# Patient Record
Sex: Male | Born: 1975 | Race: Black or African American | Hispanic: No | Marital: Married | Smoking: Former smoker
Health system: Southern US, Community
[De-identification: ages and names within clinical notes are randomized; demographics above are authoritative.]

## PROBLEM LIST (undated history)

## (undated) DIAGNOSIS — I10 Essential (primary) hypertension: Secondary | ICD-10-CM

---

## 2010-12-26 ENCOUNTER — Other Ambulatory Visit: Payer: Managed Care, Other (non HMO)

## 2012-02-01 ENCOUNTER — Encounter (HOSPITAL_COMMUNITY): Payer: Self-pay | Admitting: *Deleted

## 2012-02-01 ENCOUNTER — Emergency Department (INDEPENDENT_AMBULATORY_CARE_PROVIDER_SITE_OTHER)
Admission: EM | Admit: 2012-02-01 | Discharge: 2012-02-01 | Disposition: A | Discharge status: Home / Self Care | Payer: Managed Care, Other (non HMO) | Source: Home / Self Care | Attending: Emergency Medicine | Admitting: Emergency Medicine

## 2012-02-01 DIAGNOSIS — H6690 Otitis media, unspecified, unspecified ear: Secondary | ICD-10-CM

## 2012-02-01 DIAGNOSIS — H6090 Unspecified otitis externa, unspecified ear: Secondary | ICD-10-CM

## 2012-02-01 DIAGNOSIS — H6120 Impacted cerumen, unspecified ear: Secondary | ICD-10-CM

## 2012-02-01 DIAGNOSIS — H60399 Other infective otitis externa, unspecified ear: Secondary | ICD-10-CM

## 2012-02-01 HISTORY — DX: Essential (primary) hypertension: I10

## 2012-02-01 MED ORDER — AMOXICILLIN 500 MG PO CAPS: 1000.0000 mg | ORAL_CAPSULE | Freq: Three times a day (TID) | ORAL | Status: DC

## 2012-02-01 MED ORDER — NEOMYCIN-POLYMYXIN-HC 3.5-10000-1 OT SUSP: 3.0000 [drp] | Freq: Four times a day (QID) | OTIC | Status: DC

## 2012-02-01 NOTE — ED Provider Notes (Signed)
Chief Complaint  Patient presents with  . Ear Fullness    History of Present Illness:   The patient is a 36 year old male who presents today with a three-day history of bilateral ear congestion. He denies any pain or drainage. He's had no fever, chills, headache, nasal congestion, rhinorrhea, or sore throat.  Review of Systems:  Other than noted above, the patient denies any of the following symptoms: Systemic:  No fevers, chills, sweats, weight loss or gain, fatigue, or tiredness. Eye:  No redness, pain, discharge, itching, blurred vision, or diplopia. ENT:  No headache, nasal congestion, sneezing, itching, epistaxis, ear pain, congestion, decreased hearing, ringing in ears, vertigo, or tinnitus.  No oral lesions, sore throat, pain on swallowing, or hoarseness. Neck:  No mass, tenderness or adenopathy. Lungs:  No coughing, wheezing, or shortness of breath. Skin:  No rash or itching.  PMFSH:  Past medical history, family history, social history, meds, and allergies were reviewed.  Physical Exam:   Vital signs:  BP 147/87  Pulse 79  Temp 98.1 F (36.7 C) (Oral)  Resp 16  SpO2 98% General:  Alert and oriented.  In no distress.  Skin warm and dry. Eye:  PERRL, full EOMs, lids and conjunctiva normal.   ENT:  He has large cerumen impactions in both ear canals. These were irrigated out with some difficulty and thereafter the ear canals and TMs were slightly erythematous bilaterally.  Nasal mucosa not congested and without drainage.  Mucous membranes moist, no oral lesions, normal dentition, pharynx clear.  No cranial or facial pain to palplation. Neck:  Supple, full ROM.  No adenopathy, tenderness or mass.  Thyroid normal. Lungs:  Breath sounds clear and equal bilaterally.  No wheezes, rales or rhonchi. Heart:  Rhythm regular, without extrasystoles.  No gallops or murmers. Skin:  Clear, warm and dry.  Assessment:  The primary encounter diagnosis was Cerumen impaction. Diagnoses of Otitis  externa and Otitis media were also pertinent to this visit.  Plan:   1.  The following meds were prescribed:   New Prescriptions   AMOXICILLIN (AMOXIL) 500 MG CAPSULE    Take 2 capsules (1,000 mg total) by mouth 3 (three) times daily.   NEOMYCIN-POLYMYXIN-HYDROCORTISONE (CORTISPORIN) 3.5-10000-1 OTIC SUSPENSION    Place 3 drops into both ears 4 (four) times daily.   2.  The patient was instructed in symptomatic care and handouts were given. 3.  The patient was told to return if becoming worse in any way, if no better in 3 or 4 days, and given some red flag symptoms that would indicate earlier return.  Follow up:  The patient was told to follow up here if no better in 3 or 4 days.       Reuben Likes, MD 02/01/12 [phone]

## 2012-02-01 NOTE — ED Notes (Signed)
C/O bilat ears feeling plugged x 2 days.  Denies cold sxs.  Denies pain.  Has tried peroxide and sweet oil without relief.

## 2017-03-02 ENCOUNTER — Emergency Department (HOSPITAL_COMMUNITY)
Admission: EM | Admit: 2017-03-02 | Discharge: 2017-03-02 | Disposition: A | Discharge status: Home / Self Care | Payer: 59 | Attending: Emergency Medicine | Admitting: Emergency Medicine

## 2017-03-02 ENCOUNTER — Emergency Department (HOSPITAL_COMMUNITY): Payer: 59

## 2017-03-02 ENCOUNTER — Encounter (HOSPITAL_COMMUNITY): Payer: Self-pay | Admitting: Emergency Medicine

## 2017-03-02 DIAGNOSIS — R072 Precordial pain: Secondary | ICD-10-CM | POA: Diagnosis not present

## 2017-03-02 DIAGNOSIS — Z79899 Other long term (current) drug therapy: Secondary | ICD-10-CM | POA: Diagnosis not present

## 2017-03-02 DIAGNOSIS — R0789 Other chest pain: Secondary | ICD-10-CM | POA: Diagnosis present

## 2017-03-02 LAB — CBC
HEMATOCRIT: 52.6 % — AB (ref 39.0–52.0)
Hemoglobin: 18.5 g/dL — ABNORMAL HIGH (ref 13.0–17.0)
MCH: 31.8 pg (ref 26.0–34.0)
MCHC: 35.2 g/dL (ref 30.0–36.0)
MCV: 90.4 fL (ref 78.0–100.0)
Platelets: 304 10*3/uL (ref 150–400)
RBC: 5.82 MIL/uL — ABNORMAL HIGH (ref 4.22–5.81)
RDW: 13.2 % (ref 11.5–15.5)
WBC: 8.2 10*3/uL (ref 4.0–10.5)

## 2017-03-02 LAB — BASIC METABOLIC PANEL
Anion gap: 15 (ref 5–15)
BUN: 13 mg/dL (ref 6–20)
CO2: 26 mmol/L (ref 22–32)
Calcium: 10.2 mg/dL (ref 8.9–10.3)
Chloride: 98 mmol/L — ABNORMAL LOW (ref 101–111)
Creatinine, Ser: 1.17 mg/dL (ref 0.61–1.24)
GFR calc Af Amer: >60 mL/min (ref >60)
GFR calc non Af Amer: >60 mL/min (ref >60)
Glucose, Bld: 91 mg/dL (ref 65–99)
Potassium: 3.4 mmol/L — ABNORMAL LOW (ref 3.5–5.1)
SODIUM: 139 mmol/L (ref 135–145)

## 2017-03-02 LAB — I-STAT TROPONIN, ED: Troponin i, poc: 0 ng/mL (ref 0.00–0.08)

## 2017-03-02 MED ORDER — OMEPRAZOLE 20 MG PO CPDR: 20.0000 mg | DELAYED_RELEASE_CAPSULE | Freq: Every day | ORAL | 0 refills | Status: AC

## 2017-03-02 NOTE — ED Triage Notes (Addendum)
Pt reports intermittent R side CP for the past week. No SOB, dizziness, or nausea.

## 2017-03-02 NOTE — ED Provider Notes (Signed)
[facility]-EMERGENCY DEPT Provider Note   CSN: [id number] Arrival date & time: 03/02/17  [id number]     History   Chief Complaint Chief Complaint  Patient presents with  . Chest Pain    HPI Kevin Houston is a 41 y.o. male.  HPI Patient presents with nonspecific right-sided chest pain that is described as a ache and at times a tightness over the past week.  He states he had 2 episodes this morning.  Each episode lasted 1 or 2 minutes.  It was noted in the right anterior chest.  No significant radiation.  Denies neck pain or jaw pain.  No shortness of breath.  No diaphoresis.  Denies nausea.  No prior history of heart disease.  No family history of early cardiac disease.  He is 41 years old and he has a history of high blood pressure.  Denies hypercholesterolemia and diabetes.  No family history of early cardiac disease.  No history of tobacco abuse.  He is somewhat sedentary.  He is morbidly obese.  No history of DVT or pulmonary embolism.  No shortness of breath.  No recent cough or congestion.  No unilateral leg swelling.  No recent travel or surgery.  Denies fevers and chills.  Symptoms are mild in severity.  Completely asymptomatic at this time.   Past Medical History:  Diagnosis Date  . Hypertension     There are no active problems to display for this patient.   History reviewed. No pertinent surgical history.     Home Medications    Prior to Admission medications   Medication Sig Start Date End Date Taking? Authorizing Provider  amLODipine (NORVASC) 10 MG tablet Take 10 mg by mouth daily.   Yes [provider]  aspirin 81 MG chewable tablet Chew 162 mg daily as needed by mouth for mild pain.   Yes [provider]  chlorthalidone (HYGROTON) 25 MG tablet Take 25 mg daily by mouth. 08/02/16 08/02/17 Yes [provider]  losartan (COZAAR) 100 MG tablet Take 100 mg daily by mouth. 01/22/17  Yes [provider]  omeprazole  (PRILOSEC) 20 MG capsule Take 1 capsule (20 mg total) daily by mouth. 03/02/17   Azalia Bilisampos, Ahtziry Saathoff, MD    Family History History reviewed. No pertinent family history.  Social History Social History   Tobacco Use  . Smoking status: Never Smoker  Substance Use Topics  . Alcohol use: Yes    Comment: occasional  . Drug use: No     Allergies   Patient has no known allergies.   Review of Systems Review of Systems  All other systems reviewed and are negative.    Physical Exam Updated Vital Signs BP 126/86   Pulse 76   Temp 98 F (36.7 C) (Oral)   Resp (!) 2   Wt (!) 167.8 kg (370 lb)   SpO2 98%   Physical Exam  Constitutional: He is oriented to person, place, and time. He appears well-developed and well-nourished.  HENT:  Head: Normocephalic and atraumatic.  Eyes: EOM are normal.  Neck: Normal range of motion.  Cardiovascular: Normal rate, regular rhythm, normal heart sounds and intact distal pulses.  Pulmonary/Chest: Effort normal and breath sounds normal. No respiratory distress.  Abdominal: Soft. He exhibits no distension. There is no tenderness.  Musculoskeletal: Normal range of motion.  Neurological: He is alert and oriented to person, place, and time.  Skin: Skin is warm and dry.  Psychiatric: He has a normal mood and affect.  Judgment normal.  Nursing note and vitals reviewed.    ED Treatments / Results  Labs (all labs ordered are listed, but only abnormal results are displayed) Labs Reviewed  BASIC METABOLIC PANEL - Abnormal; Notable for the following components:      Result Value   Potassium 3.4 (*)    Chloride 98 (*)    All other components within normal limits  CBC - Abnormal; Notable for the following components:   RBC 5.82 (*)    Hemoglobin 18.5 (*)    HCT 52.6 (*)    All other components within normal limits  I-STAT TROPONIN, ED    EKG  EKG Interpretation  Date/Time:  Sunday March 02 2017 07:43:00 EST Ventricular Rate:  91 PR  Interval:    QRS Duration: 89 QT Interval:  363 QTC Calculation: 447 R Axis:   -3 Text Interpretation:  Sinus rhythm Low voltage, precordial leads Consider anterior infarct No old tracing to compare Confirmed by Devonia Farro ([id number]) on 03/02/2017 8:22:53 AM       Radiology Dg Chest 2 View  Result Date: 03/02/2017 CLINICAL DATA:  Chest pain EXAM: CHEST  2 VIEW COMPARISON:  None. FINDINGS: Lungs are clear. Heart size and pulmonary vascularity are normal. No adenopathy. No pneumothorax. No bone lesions. Trachea appears normal. IMPRESSION: No edema or consolidation. Electronically Signed   By: William  Woodruff III M.D.   On: 03/02/2017 07:55    Procedures Procedures (including critical care time)  Medications Ordered in ED Medications - No data to display   Initial Impression / Assessment and Plan / ED Course  I have reviewed the triage vital signs and the nursing notes.  Pertinent labs & imaging results that were available during my care of the patient were reviewed by me and considered in my medical decision making (see chart for details).     Nonischemic EKG.  Troponin negative.  Chest x-ray without abnormality.  Labs otherwise without significant abnormality.  Low suspicion for acute coronary syndrome.  Outpatient cardiology follow-up as he may benefit from baseline stress test.  Recommended weight loss and lifestyle changes.  Patient understands return to the ER for new or worsening symptoms.  This is much more likely to represent gastroesophageal reflux disease.  We will place the patient on Prilosec.  Doubt PE.  Doubt dissection.  Patient and family understand to return the emergency department as needed  Final Clinical Impressions(s) / ED Diagnoses   Final diagnoses:  Precordial pain    ED Discharge Orders        Ordered    omeprazole (PRILOSEC) 20 MG capsule  Daily     11 /11/18 1022       Azalia Bilisampos, Michol Emory, MD 03/02/17 1025

## 2017-03-02 NOTE — ED Notes (Signed)
ED Provider at bedside. 

## 2020-08-16 ENCOUNTER — Ambulatory Visit
Admission: RE | Admit: 2020-08-16 | Discharge: 2020-08-16 | Disposition: A | Discharge status: Home / Self Care | Payer: 59 | Source: Ambulatory Visit | Attending: Internal Medicine | Admitting: Internal Medicine

## 2020-08-16 ENCOUNTER — Other Ambulatory Visit: Payer: Self-pay | Admitting: Internal Medicine

## 2020-08-16 DIAGNOSIS — R52 Pain, unspecified: Secondary | ICD-10-CM

## 2021-03-31 ENCOUNTER — Emergency Department (HOSPITAL_BASED_OUTPATIENT_CLINIC_OR_DEPARTMENT_OTHER): Payer: No Typology Code available for payment source

## 2021-03-31 ENCOUNTER — Other Ambulatory Visit: Payer: Self-pay

## 2021-03-31 ENCOUNTER — Encounter (HOSPITAL_BASED_OUTPATIENT_CLINIC_OR_DEPARTMENT_OTHER): Payer: Self-pay | Admitting: *Deleted

## 2021-03-31 ENCOUNTER — Emergency Department (HOSPITAL_BASED_OUTPATIENT_CLINIC_OR_DEPARTMENT_OTHER)
Admission: EM | Admit: 2021-03-31 | Discharge: 2021-03-31 | Disposition: A | Discharge status: Home / Self Care | Payer: No Typology Code available for payment source | Attending: Emergency Medicine | Admitting: Emergency Medicine

## 2021-03-31 DIAGNOSIS — E876 Hypokalemia: Secondary | ICD-10-CM | POA: Diagnosis not present

## 2021-03-31 DIAGNOSIS — R0789 Other chest pain: Secondary | ICD-10-CM | POA: Diagnosis not present

## 2021-03-31 DIAGNOSIS — M79605 Pain in left leg: Secondary | ICD-10-CM

## 2021-03-31 DIAGNOSIS — Z87891 Personal history of nicotine dependence: Secondary | ICD-10-CM | POA: Insufficient documentation

## 2021-03-31 DIAGNOSIS — I10 Essential (primary) hypertension: Secondary | ICD-10-CM | POA: Insufficient documentation

## 2021-03-31 DIAGNOSIS — R079 Chest pain, unspecified: Secondary | ICD-10-CM | POA: Diagnosis present

## 2021-03-31 LAB — COMPREHENSIVE METABOLIC PANEL
ALT: 26 U/L (ref 0–44)
AST: 20 U/L (ref 15–41)
Albumin: 4 g/dL (ref 3.5–5.0)
Alkaline Phosphatase: 67 U/L (ref 38–126)
Anion gap: 12 (ref 5–15)
BUN: 15 mg/dL (ref 6–20)
CO2: 27 mmol/L (ref 22–32)
Calcium: 9.2 mg/dL (ref 8.9–10.3)
Chloride: 97 mmol/L — ABNORMAL LOW (ref 98–111)
Creatinine, Ser: 1.05 mg/dL (ref 0.61–1.24)
GFR, Estimated: >60 mL/min (ref >60)
Glucose, Bld: 89 mg/dL (ref 70–99)
Potassium: 3 mmol/L — ABNORMAL LOW (ref 3.5–5.1)
Sodium: 136 mmol/L (ref 135–145)
Total Bilirubin: 0.4 mg/dL (ref 0.3–1.2)
Total Protein: 7.9 g/dL (ref 6.5–8.1)

## 2021-03-31 LAB — CBC WITH DIFFERENTIAL/PLATELET
Abs Immature Granulocytes: 0.03 10*3/uL (ref 0.00–0.07)
Basophils Absolute: 0.1 10*3/uL (ref 0.0–0.1)
Basophils Relative: 1 %
Eosinophils Absolute: 0.2 10*3/uL (ref 0.0–0.5)
Eosinophils Relative: 2 %
HCT: 49.7 % (ref 39.0–52.0)
Hemoglobin: 17.5 g/dL — ABNORMAL HIGH (ref 13.0–17.0)
Immature Granulocytes: 0 %
Lymphocytes Relative: 50 %
Lymphs Abs: 5.2 10*3/uL — ABNORMAL HIGH (ref 0.7–4.0)
MCH: 31.8 pg (ref 26.0–34.0)
MCHC: 35.2 g/dL (ref 30.0–36.0)
MCV: 90.2 fL (ref 80.0–100.0)
Monocytes Absolute: 0.8 10*3/uL (ref 0.1–1.0)
Monocytes Relative: 7 %
Neutro Abs: 4.1 10*3/uL (ref 1.7–7.7)
Neutrophils Relative %: 40 %
Platelets: 301 10*3/uL (ref 150–400)
RBC: 5.51 MIL/uL (ref 4.22–5.81)
RDW: 12.8 % (ref 11.5–15.5)
WBC: 10.2 10*3/uL (ref 4.0–10.5)
nRBC: 0 % (ref 0.0–0.2)

## 2021-03-31 LAB — D-DIMER, QUANTITATIVE: D-Dimer, Quant: 0.4 ug/mL-FEU (ref 0.00–0.50)

## 2021-03-31 LAB — TROPONIN I (HIGH SENSITIVITY)
Troponin I (High Sensitivity): <2 ng/L (ref <18)
Troponin I (High Sensitivity): 2 ng/L (ref <18)

## 2021-03-31 MED ORDER — KETOROLAC TROMETHAMINE 30 MG/ML IJ SOLN
30.0000 mg | Freq: Once | INTRAMUSCULAR | Status: AC
  Administered 2021-03-31: 30 mg via INTRAVENOUS
  Filled 2021-03-31: qty 1

## 2021-03-31 MED ORDER — POTASSIUM CHLORIDE CRYS ER 20 MEQ PO TBCR
40.0000 meq | EXTENDED_RELEASE_TABLET | Freq: Every day | ORAL | 0 refills | Status: AC
Start: 2021-03-31 — End: 2021-04-05

## 2021-03-31 NOTE — ED Provider Notes (Signed)
Emergency Department Provider Note   I have reviewed the triage vital signs and the nursing notes.   HISTORY  Chief Complaint Chest Pain   HPI Kevin Houston is a 45 y.o. male with a past medical history of hypertension presents emergency department with acute onset sharp right upper chest pain.  Symptoms began while he was in his car.  He was able to reposition and pain resolved fairly quickly.  He did not have return of discomfort.  He is not currently experiencing any pain or pressure in the chest.  No shortness of breath.  He does report some pain behind the left knee which began several days ago.  He mainly notices it when he is sitting down.  Denies any leg swelling.  Over the Thanksgiving holiday he did have a 6-hour car ride but no other prolonged inactivity or recent surgery.  No prior history of DVT or ACS.   Past Medical History:  Diagnosis Date   Hypertension     There are no problems to display for this patient.   History reviewed. No pertinent surgical history.  Allergies Patient has no known allergies.  No family history on file.  Social History Social History   Tobacco Use   Smoking status: Former    Types: Cigarettes  Substance Use Topics   Alcohol use: Yes    Comment: occasional   Drug use: No    Review of Systems  Constitutional: No fever/chills Eyes: No visual changes. ENT: No sore throat. Cardiovascular: Positive chest pain. Respiratory: Denies shortness of breath. Gastrointestinal: No abdominal pain.  No nausea, no vomiting.  No diarrhea.  No constipation. Genitourinary: Negative for dysuria. Musculoskeletal: Negative for back pain. Positive left leg pain.  Skin: Negative for rash. Neurological: Negative for headaches, focal weakness or  10-point ROS otherwise negative.  ____________________________________________   PHYSICAL EXAM:  VITAL SIGNS: ED Triage Vitals  Enc Vitals Group     BP 03/31/21 1943 121/84     Pulse Rate  03/31/21 1943 83     Resp 03/31/21 1943 19     Temp 03/31/21 1943 98 F (36.7 C)     Temp Source 03/31/21 1943 Oral     SpO2 03/31/21 1943 98 %     Weight 03/31/21 1941 (!) 321 lb 14 oz (146 kg)     Height 03/31/21 1941 6\' 3"  (1.905 m)    Constitutional: Alert and oriented. Well appearing and in no acute distress. Eyes: Conjunctivae are normal.  Head: Atraumatic. Nose: No congestion/rhinnorhea. Mouth/Throat: Mucous membranes are moist.  Neck: No stridor.   Cardiovascular: Normal rate, regular rhythm. Good peripheral circulation. Grossly normal heart sounds.   Respiratory: Normal respiratory effort.  No retractions. Lungs CTAB. Gastrointestinal: Soft and nontender. No distention.  Musculoskeletal: No lower extremity tenderness nor edema. No gross deformities of extremities. Neurologic:  Normal speech and language. No gross focal neurologic deficits are appreciated.  Skin:  Skin is warm, dry and intact. No rash noted.  ____________________________________________   LABS (all labs ordered are listed, but only abnormal results are displayed)  Labs Reviewed  COMPREHENSIVE METABOLIC PANEL - Abnormal; Notable for the following components:      Result Value   Potassium 3.0 (*)    Chloride 97 (*)    All other components within normal limits  CBC WITH DIFFERENTIAL/PLATELET - Abnormal; Notable for the following components:   Hemoglobin 17.5 (*)    Lymphs Abs 5.2 (*)    All other components within normal limits  D-DIMER, QUANTITATIVE  TROPONIN I (HIGH SENSITIVITY)  TROPONIN I (HIGH SENSITIVITY)   ____________________________________________  EKG   EKG Interpretation  Date/Time:  Saturday March 31 2021 19:42:14 EST Ventricular Rate:  80 PR Interval:  167 QRS Duration: 105 QT Interval:  404 QTC Calculation: 466 R Axis:   4 Text Interpretation: Sinus rhythm Low voltage, precordial leads Consider anterior infarct Similar to 2018 tracing Confirmed by Nanda Quinton [phone]) on  03/31/2021 7:47:51 PM        ____________________________________________  RADIOLOGY  DG Chest 2 View  Result Date: 03/31/2021 CLINICAL DATA:  Acute right chest pain for 1 hour EXAM: CHEST - 2 VIEW COMPARISON:  03/02/2017 FINDINGS: The heart size and mediastinal contours are within normal limits. Both lungs are clear. The visualized skeletal structures are unremarkable. IMPRESSION: No active cardiopulmonary disease. Electronically Signed   By: Randa Ngo M.D.   On: 03/31/2021 20:45   US Venous Img Lower  Left (DVT Study)  Result Date: 03/31/2021 CLINICAL DATA:  Left leg pain EXAM: LEFT LOWER EXTREMITY VENOUS DOPPLER ULTRASOUND TECHNIQUE: Gray-scale sonography with compression, as well as color and duplex ultrasound, were performed to evaluate the deep venous system(s) from the level of the common femoral vein through the popliteal and proximal calf veins. COMPARISON:  None. FINDINGS: VENOUS Normal compressibility of the common femoral, superficial femoral, and popliteal veins, as well as the visualized calf veins. Visualized portions of profunda femoral vein and great saphenous vein unremarkable. No filling defects to suggest DVT on grayscale or color Doppler imaging. Doppler waveforms show normal direction of venous flow, normal respiratory plasticity and response to augmentation. Limited views of the contralateral common femoral vein are unremarkable. OTHER None. Limitations: none IMPRESSION: Negative. Electronically Signed   By: Rolm Baptise M.D.   On: 03/31/2021 20:36    ____________________________________________   PROCEDURES  Procedure(s) performed:   Procedures  None ____________________________________________   INITIAL IMPRESSION / ASSESSMENT AND PLAN / ED COURSE  Pertinent labs & imaging results that were available during my care of the patient were reviewed by me and considered in my medical decision making (see chart for details).   Patient presents emergency  department for cute onset sharp chest pain in the right chest.  Lower suspicion for ACS although patient does have risk factor of hypertension.  EKG here is reassuring.  Considered PE and sent D-dimer which is come back normal.  Initial troponin negative.  DVT ultrasound performed of the left lower extremity with no DVT appreciated.  Chest x-ray is clear without pneumothorax or infiltrate.  Differential includes all life-threatening causes for chest pain. This includes but is not exclusive to acute coronary syndrome, aortic dissection, pulmonary embolism, cardiac tamponade, community-acquired pneumonia, pericarditis, musculoskeletal chest wall pain, etc.  Labs reviewed. D dimer normal. Troponin negative x 2. Mild hypokalemia. Will replace and f/u with PCP for repeat labs in the coming week. No DVT on [place]. Normal CXR. Stable for discharge.  ____________________________________________  FINAL CLINICAL IMPRESSION(S) / ED DIAGNOSES  Final diagnoses:  Atypical chest pain  Left leg pain  Hypokalemia     MEDICATIONS GIVEN DURING THIS VISIT:  Medications  ketorolac (TORADOL) 30 MG/ML injection 30 mg (30 mg Intravenous Given 03/31/21 2200)     NEW OUTPATIENT MEDICATIONS STARTED DURING THIS VISIT:  Discharge Medication List as of 03/31/2021 10:54 PM     START taking these medications   Details  potassium chloride SA (KLOR-CON M) 20 MEQ tablet Take 2 tablets (40 mEq total) by mouth daily  for 5 days., Starting Sat 03/31/2021, Until Thu 04/05/2021, Normal        Note:  This document was prepared using Dragon voice recognition software and may include unintentional dictation errors.  Alona Bene, MD, [facility] Emergency Medicine    [place] President, Arlyss Repress, MD 04/01/21 [phone]

## 2021-03-31 NOTE — ED Triage Notes (Signed)
Pt reports chest pain, right side, onset just pta. States also had pain in the back of his left knee x 4days

## 2021-03-31 NOTE — ED Notes (Signed)
Patient discharged to home.  All discharge instructions reviewed.  Patient verbalized understanding via teachback method.  VS WDL.  Respirations even and unlabored.  Ambulatory out of ED.   °

## 2021-03-31 NOTE — Discharge Instructions (Signed)
You were seen in the emergency department today with chest discomfort.  Your lab work here is reassuring.  I am supplementing your potassium for the next several days.  Please have your primary care doctor recheck these labs in the next week.  Return to the emergency department any new or suddenly worsening symptoms.

## 2021-04-07 ENCOUNTER — Other Ambulatory Visit: Payer: Self-pay

## 2021-04-07 ENCOUNTER — Ambulatory Visit
Admission: EM | Admit: 2021-04-07 | Discharge: 2021-04-07 | Disposition: A | Discharge status: Home / Self Care | Payer: No Typology Code available for payment source | Attending: Internal Medicine | Admitting: Internal Medicine

## 2021-04-07 ENCOUNTER — Encounter: Payer: Self-pay | Admitting: Emergency Medicine

## 2021-04-07 DIAGNOSIS — M79671 Pain in right foot: Secondary | ICD-10-CM | POA: Diagnosis not present

## 2021-04-07 DIAGNOSIS — M109 Gout, unspecified: Secondary | ICD-10-CM | POA: Diagnosis not present

## 2021-04-07 MED ORDER — PREDNISONE 20 MG PO TABS: 40.0000 mg | ORAL_TABLET | Freq: Every day | ORAL | 0 refills | Status: AC

## 2021-04-07 NOTE — ED Triage Notes (Signed)
Right foot pain he describes as deep and throbbing, worsened with pressure, believes it's a gout flare up, hx of same. Denies any swelling or injury to area. States the pain started yesterday.

## 2021-04-07 NOTE — ED Provider Notes (Signed)
Kevin Houston    CSN: [id number] Arrival date & time: 04/07/21  0806      History   Chief Complaint Chief Complaint  Patient presents with   Foot Pain    HPI Kevin Houston is a 45 y.o. male.   Patient presents with right foot pain that started yesterday.  Patient reports that he has a history of gout and this "feels the same".  Denies any apparent injury to the foot.  Pain is located on the lateral portion of the dorsal surface of the right foot.  Pain is exacerbated with bearing weight.  Denies any numbness or tingling.  Has taken ibuprofen with no improvement in pain.   Foot Pain   Past Medical History:  Diagnosis Date   Hypertension     There are no problems to display for this patient.   History reviewed. No pertinent surgical history.     Home Medications    Prior to Admission medications   Medication Sig Start Date End Date Taking? Authorizing Provider  predniSONE (DELTASONE) 20 MG tablet Take 2 tablets (40 mg total) by mouth daily for 5 days. 04/07/21 04/12/21 Yes Ridhi Hoffert, Acie Fredrickson, FNP  amLODipine (NORVASC) 10 MG tablet Take 10 mg by mouth daily.    [provider]  aspirin 81 MG chewable tablet Chew 162 mg daily as needed by mouth for mild pain.    [provider]  chlorthalidone (HYGROTON) 25 MG tablet Take 25 mg daily by mouth. 08/02/16 08/02/17  [provider]  losartan (COZAAR) 100 MG tablet Take 100 mg daily by mouth. 01/22/17   [provider]  omeprazole (PRILOSEC) 20 MG capsule Take 1 capsule (20 mg total) daily by mouth. 03/02/17   Kevin Bilis, MD  potassium chloride SA (KLOR-CON M) 20 MEQ tablet Take 2 tablets (40 mEq total) by mouth daily for 5 days. 03/31/21 04/05/21  Long, Arlyss Repress, MD    Family History History reviewed. No pertinent family history.  Social History Social History   Tobacco Use   Smoking status: Former    Types: Cigarettes  Substance Use Topics   Alcohol use: Yes    Comment:  occasional   Drug use: No     Allergies   Patient has no known allergies.   Review of Systems Review of Systems Per HPI  Physical Exam Triage Vital Signs ED Triage Vitals  Enc Vitals Group     BP 04/07/21 0817 131/89     Pulse Rate 04/07/21 0817 89     Resp 04/07/21 0817 16     Temp 04/07/21 0817 98.5 F (36.9 C)     Temp Source 04/07/21 0817 Oral     SpO2 04/07/21 0817 96 %     Weight --      Height --      Head Circumference --      Peak Flow --      Pain Score 04/07/21 0818 9     Pain Loc --      Pain Edu? --      Excl. in GC? --    No data found.  Updated Vital Signs BP 131/89 (BP Location: Left Arm)    Pulse 89    Temp 98.5 F (36.9 C) (Oral)    Resp 16    SpO2 96%   Visual Acuity Right Eye Distance:   Left Eye Distance:   Bilateral Distance:    Right Eye Near:   Left Eye Near:  Bilateral Near:     Physical Exam Constitutional:      General: He is not in acute distress.    Appearance: Normal appearance. He is not toxic-appearing or diaphoretic.  HENT:     Head: Normocephalic and atraumatic.  Eyes:     Extraocular Movements: Extraocular movements intact.     Conjunctiva/sclera: Conjunctivae normal.  Pulmonary:     Effort: Pulmonary effort is normal.  Musculoskeletal:     Right foot: Normal range of motion and normal capillary refill. Swelling, tenderness and bony tenderness present. No deformity. Normal pulse.     Left foot: Normal.       Feet:     Comments: Tenderness to palpation with mild swelling noted to circled area on diagram.  Neurovascular intact.  Full range of motion of ankle, foot, toes.  No erythema, lacerations, abrasions noted.  Neurological:     General: No focal deficit present.     Mental Status: He is alert and oriented to person, place, and time. Mental status is at baseline.  Psychiatric:        Mood and Affect: Mood normal.        Behavior: Behavior normal.        Thought Content: Thought content normal.         Judgment: Judgment normal.     UC Treatments / Results  Labs (all labs ordered are listed, but only abnormal results are displayed) Labs Reviewed - No data to display  EKG   Radiology No results found.  Procedures Procedures (including critical Houston time)  Medications Ordered in UC Medications - No data to display  Initial Impression / Assessment and Plan / UC Course  I have reviewed the triage vital signs and the nursing notes.  Pertinent labs & imaging results that were available during my [place] of the patient were reviewed by me and considered in my medical decision making (see chart for details).     Physical exam is consistent with a gout flareup and patient has a history of this.  Will treat as gout flareup with prednisone steroid as patient reports that he has had the most success with prednisone in the past.  Patient declined colchicine as he states that it is "too expensive".  Do not think imaging is necessary given there is no apparent injury and patient has history of gout.  Patient to avoid aspirin if possible while on prednisone.  Discussed strict return precautions.  Patient verbalized understanding and was agreeable with plan. Final Clinical Impressions(s) / UC Diagnoses   Final diagnoses:  Acute gout of right foot, unspecified cause  Right foot pain     Discharge Instructions      It is suspected that you have a gout flareup.  This is being treated with prednisone steroid.  Please follow-up if symptoms persist or worsen.    ED Prescriptions     Medication Sig Dispense Auth. Provider   predniSONE (DELTASONE) 20 MG tablet Take 2 tablets (40 mg total) by mouth daily for 5 days. 10 tablet Gustavus Bryant, [place]      PDMP not reviewed this encounter.   Gustavus Bryant, [place] 04/07/21 [phone]

## 2021-04-07 NOTE — Discharge Instructions (Signed)
It is suspected that you have a gout flareup.  This is being treated with prednisone steroid.  Please follow-up if symptoms persist or worsen.

## 2021-04-12 ENCOUNTER — Other Ambulatory Visit: Payer: Self-pay

## 2021-04-12 ENCOUNTER — Ambulatory Visit
Admission: EM | Admit: 2021-04-12 | Discharge: 2021-04-12 | Disposition: A | Discharge status: Home / Self Care | Payer: No Typology Code available for payment source | Attending: Nurse Practitioner | Admitting: Nurse Practitioner

## 2021-04-12 DIAGNOSIS — Z1152 Encounter for screening for COVID-19: Secondary | ICD-10-CM

## 2021-04-12 DIAGNOSIS — R051 Acute cough: Secondary | ICD-10-CM | POA: Diagnosis not present

## 2021-04-12 DIAGNOSIS — J069 Acute upper respiratory infection, unspecified: Secondary | ICD-10-CM

## 2021-04-12 MED ORDER — DEXAMETHASONE SODIUM PHOSPHATE 10 MG/ML IJ SOLN
10.0000 mg | Freq: Once | INTRAMUSCULAR | Status: AC
  Administered 2021-04-12: 09:00:00 10 mg via INTRAMUSCULAR

## 2021-04-12 MED ORDER — PROMETHAZINE-DM 6.25-15 MG/5ML PO SYRP: 5.0000 mL | ORAL_SOLUTION | Freq: Four times a day (QID) | ORAL | 0 refills | Status: AC | PRN

## 2021-04-12 MED ORDER — AZITHROMYCIN 250 MG PO TABS: 250.0000 mg | ORAL_TABLET | Freq: Every day | ORAL | 0 refills | Status: AC

## 2021-04-12 NOTE — ED Provider Notes (Signed)
[facility]    CSN: [id number] Arrival date & time: 04/12/21  4665      History   Chief Complaint No chief complaint on file.   HPI Kevin Houston is a 45 y.o. male.   Subjective:   Kevin Houston is a 45 y.o. male who presents for evaluation of symptoms of a URI. Symptoms include suspected fevers but not measured at home, headache, myalgias, sneezing, productive cough, yellow nasal discharge, and swollen glands. Onset of symptoms was 4 days ago and has been gradually worsening since that time. He denies any lightheadedness, shortness of breath, wheezing, chills, sore throat, nausea, vomiting or diarrhea. He doesn't have much of an appetite but is drinking plenty of fluids. He has tried delsym, mucinex and elderberry syrup without any improvement in symptoms. Notably, his son is sick with similar symptoms. He took a home COVID test 3 days ago which was negative for both him and his son. The patient has been vaccinated against flu and COVID with all boosters.   The following portions of the patient's history were reviewed and updated as appropriate: allergies, current medications, past family history, past medical history, past social history, past surgical history, and problem list.    Past Medical History:  Diagnosis Date   Hypertension     There are no problems to display for this patient.   History reviewed. No pertinent surgical history.     Home Medications    Prior to Admission medications   Medication Sig Start Date End Date Taking? Authorizing Provider  azithromycin (ZITHROMAX) 250 MG tablet Take 1 tablet (250 mg total) by mouth daily. Take first 2 tablets together, then 1 every day until finished. 04/12/21  Yes Lurline Idol, FNP  promethazine-dextromethorphan (PROMETHAZINE-DM) 6.25-15 MG/5ML syrup Take 5 mLs by mouth 4 (four) times daily as needed for cough. 04/12/21  Yes Lurline Idol, FNP  amLODipine (NORVASC) 10 MG tablet Take 10 mg by mouth  daily.    [provider]  aspirin 81 MG chewable tablet Chew 162 mg daily as needed by mouth for mild pain.    [provider]  chlorthalidone (HYGROTON) 25 MG tablet Take 25 mg daily by mouth. 08/02/16 08/02/17  [provider]  losartan (COZAAR) 100 MG tablet Take 100 mg daily by mouth. 01/22/17   [provider]  omeprazole (PRILOSEC) 20 MG capsule Take 1 capsule (20 mg total) daily by mouth. 03/02/17   Azalia Bilis, MD  potassium chloride SA (KLOR-CON M) 20 MEQ tablet Take 2 tablets (40 mEq total) by mouth daily for 5 days. 03/31/21 04/05/21  Long, Arlyss Repress, MD  predniSONE (DELTASONE) 20 MG tablet Take 2 tablets (40 mg total) by mouth daily for 5 days. 04/07/21 04/12/21  Gustavus Bryant, FNP    Family History History reviewed. No pertinent family history.  Social History Social History   Tobacco Use   Smoking status: Former    Types: Cigarettes  Substance Use Topics   Alcohol use: Yes    Comment: occasional   Drug use: No     Allergies   Patient has no known allergies.   Review of Systems Review of Systems  Constitutional:  Positive for appetite change, fatigue and fever. Negative for chills.  HENT:  Positive for congestion, rhinorrhea and sneezing. Negative for ear pain and sore throat.   Respiratory:  Positive for cough. Negative for shortness of breath and wheezing.   Cardiovascular:  Negative for chest pain.  Gastrointestinal:  Negative for diarrhea,  nausea and vomiting.  Musculoskeletal:  Positive for myalgias.  Neurological:  Positive for headaches. Negative for dizziness.  All other systems reviewed and are negative.   Physical Exam Triage Vital Signs ED Triage Vitals  Enc Vitals Group     BP 04/12/21 0828 (!) 127/91     Pulse Rate 04/12/21 0828 (!) 103     Resp 04/12/21 0828 20     Temp 04/12/21 0828 99.1 F (37.3 C)     Temp Source 04/12/21 0828 Oral     SpO2 04/12/21 0828 97 %     Weight --      Height --      Head  Circumference --      Peak Flow --      Pain Score 04/12/21 0827 6     Pain Loc --      Pain Edu? --      Excl. in GC? --    No data found.  Updated Vital Signs BP (!) 127/91 (BP Location: Left Arm)    Pulse (!) 103    Temp 99.1 F (37.3 C) (Oral)    Resp 20    SpO2 97%   Visual Acuity Right Eye Distance:   Left Eye Distance:   Bilateral Distance:    Right Eye Near:   Left Eye Near:    Bilateral Near:     Physical Exam Vitals reviewed.  Constitutional:      General: He is not in acute distress.    Appearance: Normal appearance. He is ill-appearing. He is not toxic-appearing.  HENT:     Head: Normocephalic.     Right Ear: Tympanic membrane normal.     Left Ear: Tympanic membrane normal.     Nose: Congestion present.     Mouth/Throat:     Mouth: Mucous membranes are moist.  Eyes:     Conjunctiva/sclera: Conjunctivae normal.  Cardiovascular:     Rate and Rhythm: Normal rate and regular rhythm.  Pulmonary:     Effort: Pulmonary effort is normal.     Breath sounds: Normal breath sounds.  Abdominal:     Palpations: Abdomen is soft.  Musculoskeletal:        General: Normal range of motion.     Cervical back: Normal range of motion and neck supple.  Lymphadenopathy:     Cervical: Cervical adenopathy present.  Skin:    General: Skin is warm and dry.  Neurological:     General: No focal deficit present.     Mental Status: He is alert and oriented to person, place, and time.  Psychiatric:        Mood and Affect: Mood normal.        Behavior: Behavior normal.     UC Treatments / Results  Labs (all labs ordered are listed, but only abnormal results are displayed) Labs Reviewed  COVID-19, FLU A+B NAA    EKG   Radiology No results found.  Procedures Procedures (including critical care time)  Medications Ordered in UC Medications  dexamethasone (DECADRON) injection 10 mg (has no administration in time range)    Initial Impression / Assessment and Plan /  UC Course  I have reviewed the triage vital signs and the nursing notes.  Pertinent labs & imaging results that were available during my care of the patient were reviewed by me and considered in my medical decision making (see chart for details).     45 year old male history of attention presenting with a 4-day history  of headache, myalgias, sneezing, cough, nasal discharge, swollen glands and subjective fevers.  No improvement in symptoms despite supportive measures.  Sick contacts at home with similar symptoms.  Negative home COVID test 3 days ago.  He is fully vaccinated against flu and COVID.  On exam, patient has low-grade fever of 99.1.  Mildly tachycardic.  Blood pressure within normal limits.  Patient looks acutely ill but nontoxic.   Plan: COVID and flu test pending Decadron injection given in clinic Z-Pak as directed Promethazine DM as needed for cough Discussed diagnosis and treatment of URI. Suggested symptomatic OTC remedies. Nasal saline spray for congestion. Follow up as needed.  Today's evaluation has revealed no signs of a dangerous process. Discussed diagnosis with patient and/or guardian. Patient and/or guardian aware of their diagnosis, possible red flag symptoms to watch out for and need for close follow up. Patient and/or guardian understands verbal and written discharge instructions. Patient and/or guardian comfortable with plan and disposition.  Patient and/or guardian has a clear mental status at this time, good insight into illness (after discussion and teaching) and has clear judgment to make decisions regarding their care  This care was provided during an unprecedented National Emergency due to the Novel Coronavirus (COVID-19) pandemic. COVID-19 infections and transmission risks place heavy strains on healthcare resources.  As this pandemic evolves, our facility, providers, and staff strive to respond fluidly, to remain operational, and to provide care relative to  available resources and information. Outcomes are unpredictable and treatments are without well-defined guidelines. Further, the impact of COVID-19 on all aspects of urgent care, including the impact to patients seeking care for reasons other than COVID-19, is unavoidable during this national emergency. At this time of the global pandemic, management of patients has significantly changed, even for non-COVID positive patients given high local and regional COVID volumes at this time requiring high healthcare system and resource utilization. The standard of care for management of both COVID suspected and non-COVID suspected patients continues to change rapidly at the local, regional, national, and global levels. This patient was worked up and treated to the best available but ever changing evidence and resources available at this current time.   Documentation was completed with the aid of voice recognition software. Transcription may contain typographical errors.  Final Clinical Impressions(s) / UC Diagnoses   Final diagnoses:  Acute cough  Viral upper respiratory tract infection  Encounter for screening for COVID-19     Discharge Instructions      Take medications as prescribed. You may take tylenol or ibuprofen as needed for fevers/headache/body aches. Drink plenty of fluids. Stay in home isolation until you receive results of your COVID test. You will only be notified for positive results. You may go online to MyChart and review your results. Go to the ED immediately if you get worse or have any other symptoms.  Feel better soon!  Lelon Mast, FNP-C       ED Prescriptions     Medication Sig Dispense Auth. Provider   azithromycin (ZITHROMAX) 250 MG tablet Take 1 tablet (250 mg total) by mouth daily. Take first 2 tablets together, then 1 every day until finished. 6 tablet Lurline Idol, FNP   promethazine-dextromethorphan (PROMETHAZINE-DM) 6.25-15 MG/5ML syrup Take 5 mLs by mouth 4 (four)  times daily as needed for cough. 118 mL Lurline Idol, FNP      PDMP not reviewed this encounter.   Cathlean Sauer [place], [place] 04/12/21 [phone]

## 2021-04-12 NOTE — ED Triage Notes (Signed)
Pt reports having headaches, cough, congestion (thick and yellow mucus per patient). Started: about 4 days ago

## 2021-04-12 NOTE — Discharge Instructions (Signed)
Take medications as prescribed. You may take tylenol or ibuprofen as needed for fevers/headache/body aches. Drink plenty of fluids. Stay in home isolation until you receive results of your COVID test. You will only be notified for positive results. You may go online to MyChart and review your results. Go to the ED immediately if you get worse or have any other symptoms.  Feel better soon!  Demetreus Lothamer, FNP-C   

## 2021-04-13 LAB — COVID-19, FLU A+B NAA
Influenza A, NAA: DETECTED — AB
Influenza B, NAA: NOT DETECTED
SARS-CoV-2, NAA: NOT DETECTED

## 2022-05-14 IMAGING — US US EXTREM LOW VENOUS*L*
1 series · 14 of 24 positions shown · non-contrast
Comparison: None.

CLINICAL DATA: Left leg pain

EXAM:
LEFT LOWER EXTREMITY VENOUS DOPPLER ULTRASOUND
TECHNIQUE: Gray-scale sonography with compression, as well as color and duplex
ultrasound, were performed to evaluate the deep venous system(s)
from the level of the common femoral vein through the popliteal and
proximal calf veins.

[Series 1: us extrem low venous*left* · 14 of 42 slices shown]
[im 1/42]
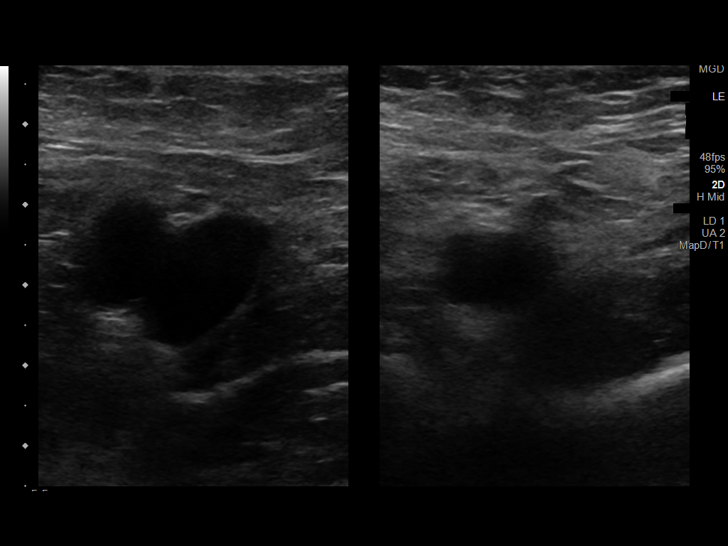
[im 4/42]
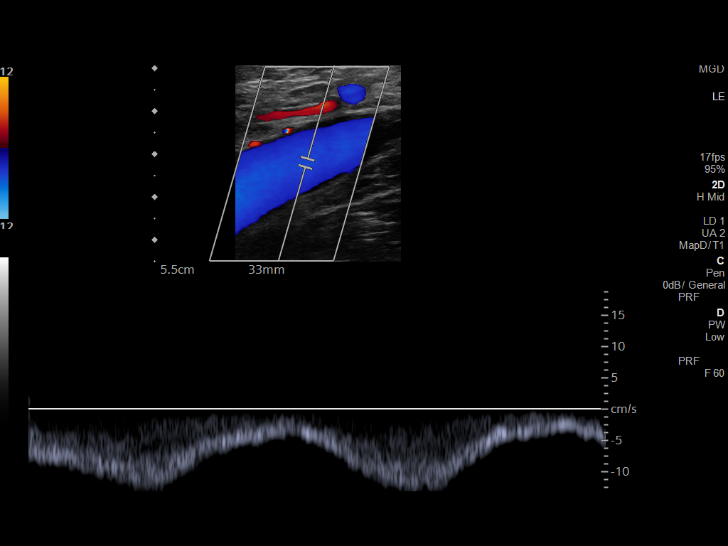
[im 8/42]
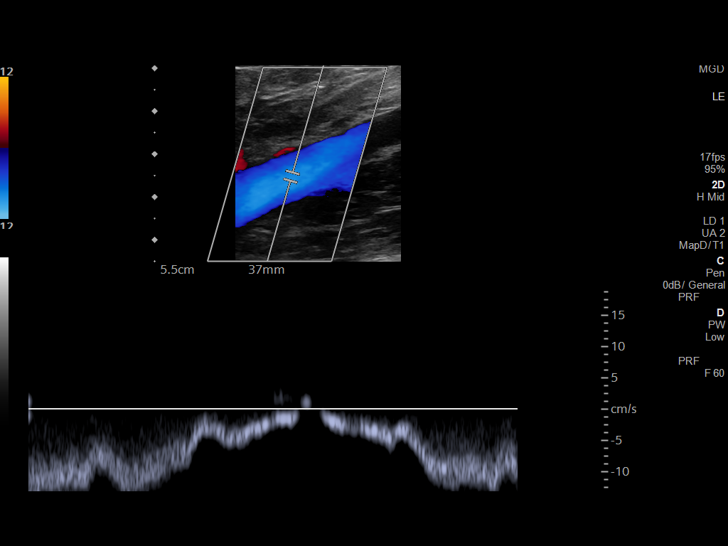
[im 11/42]
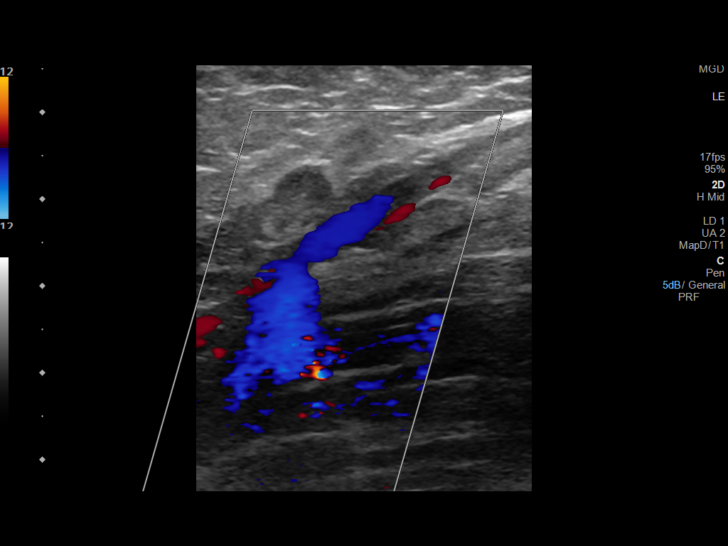
[im 13/42]
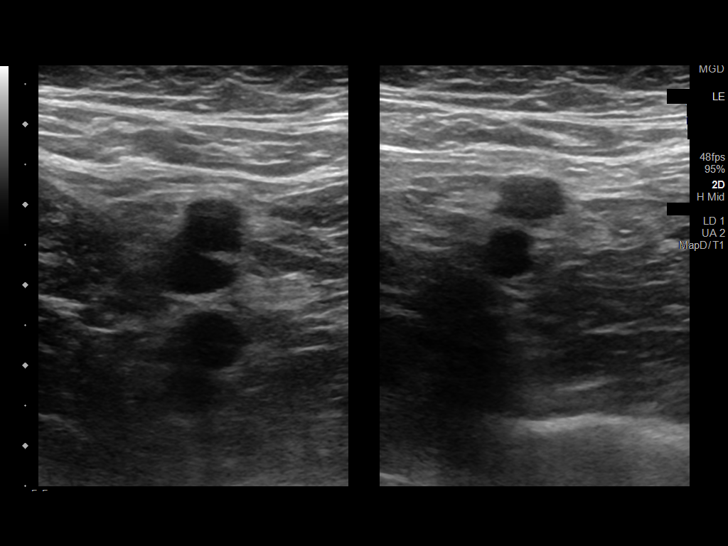
[im 17/42]
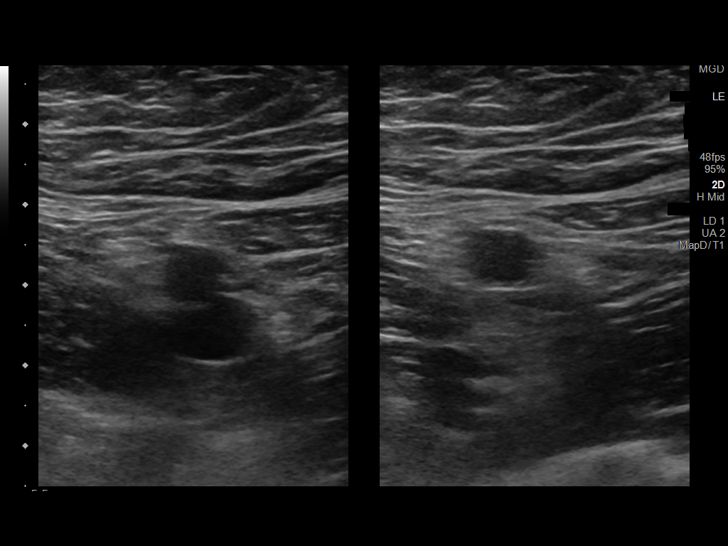
[im 20/42]
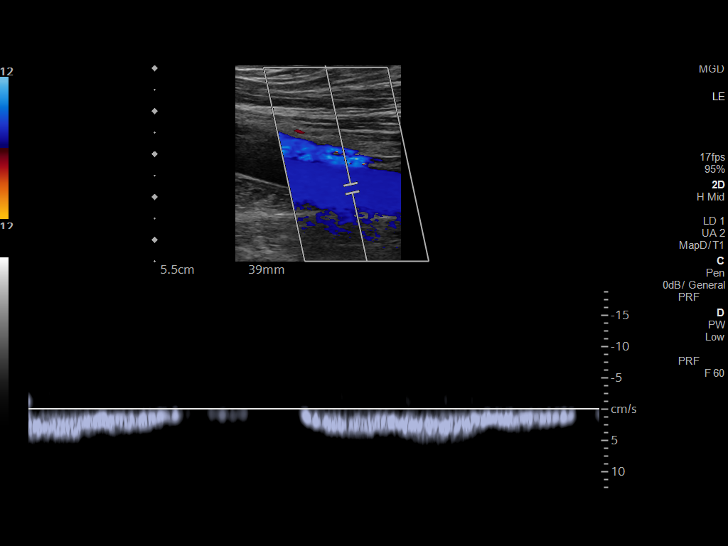
[im 22/42]
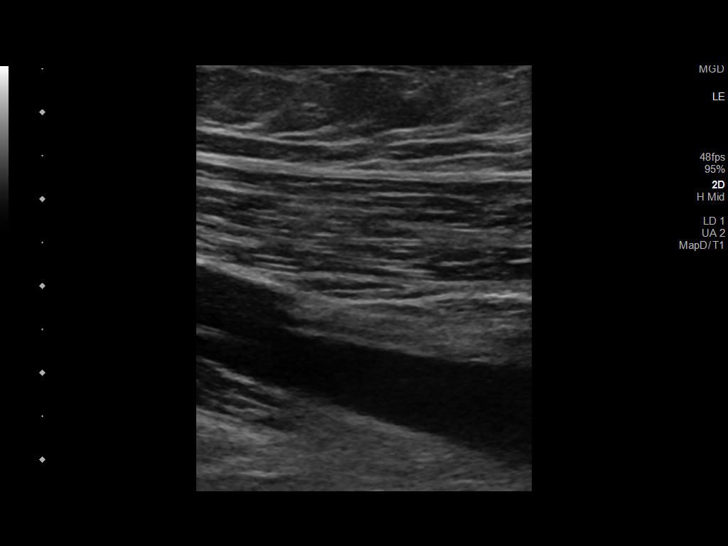
[im 25/42]
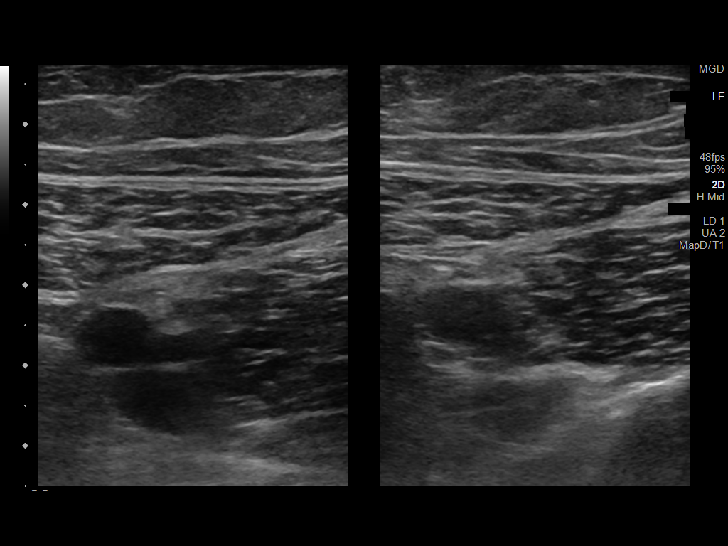
[im 29/42]
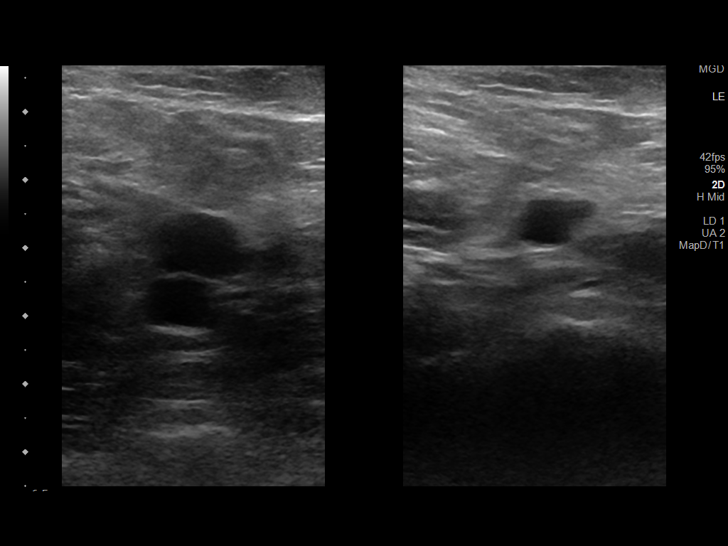
[im 33/42]
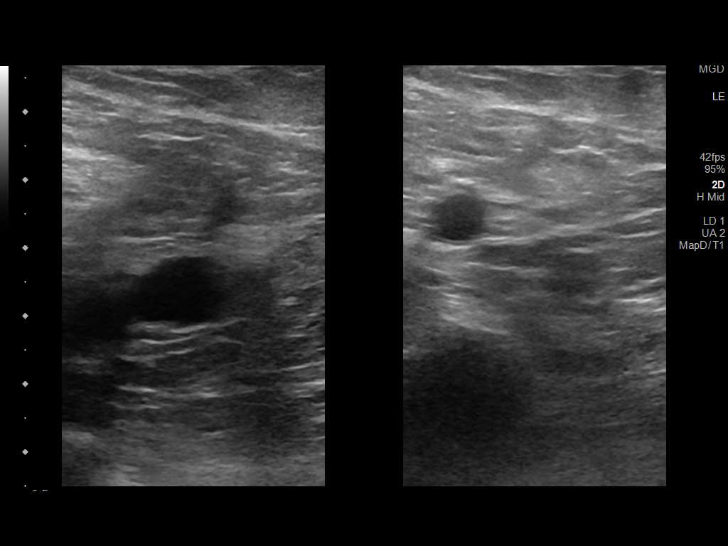
[im 34/42]
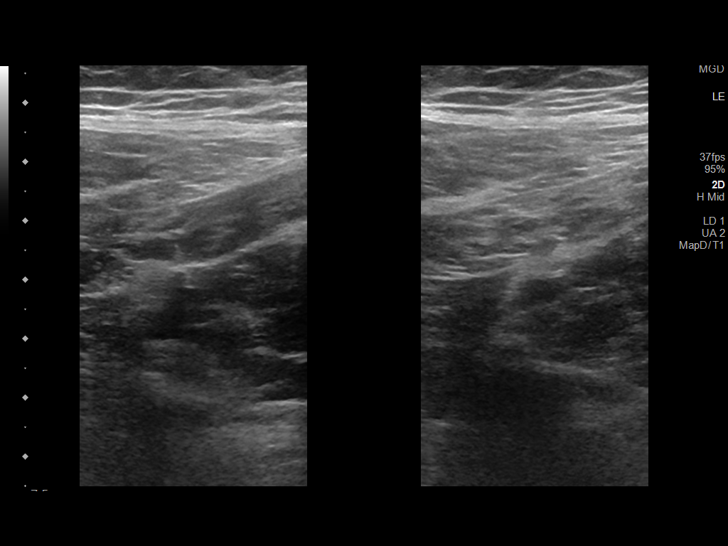
[im 38/42]
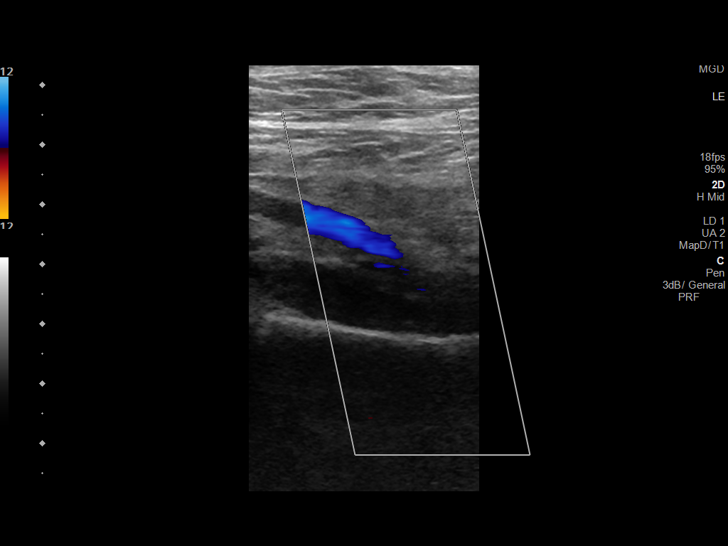
[im 42/42]
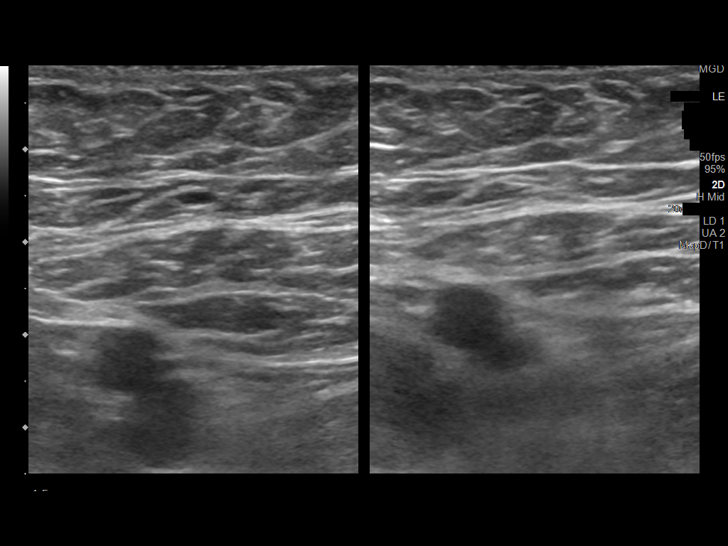

[14 of 24 positions shown; findings below may reference images not displayed]

FINDINGS: VENOUS

Normal compressibility of the common femoral, superficial femoral,
and popliteal veins, as well as the visualized calf veins.
Visualized portions of profunda femoral vein and great saphenous
vein unremarkable. No filling defects to suggest DVT on grayscale or
color Doppler imaging. Doppler waveforms show normal direction of
venous flow, normal respiratory plasticity and response to
augmentation.

Limited views of the contralateral common femoral vein are
unremarkable.

OTHER

None.

Limitations: none
IMPRESSION: Negative.

## 2023-06-27 ENCOUNTER — Other Ambulatory Visit: Payer: Self-pay | Admitting: Neurosurgery

## 2023-06-27 DIAGNOSIS — M5416 Radiculopathy, lumbar region: Secondary | ICD-10-CM

## 2023-07-01 ENCOUNTER — Emergency Department (HOSPITAL_COMMUNITY)

## 2023-07-01 ENCOUNTER — Other Ambulatory Visit: Payer: Self-pay

## 2023-07-01 ENCOUNTER — Emergency Department (HOSPITAL_COMMUNITY)
Admission: EM | Admit: 2023-07-01 | Discharge: 2023-07-01 | Disposition: A | Discharge status: Home / Self Care | Attending: Emergency Medicine | Admitting: Emergency Medicine

## 2023-07-01 ENCOUNTER — Encounter (HOSPITAL_COMMUNITY): Payer: Self-pay | Admitting: Emergency Medicine

## 2023-07-01 DIAGNOSIS — R0602 Shortness of breath: Secondary | ICD-10-CM | POA: Diagnosis not present

## 2023-07-01 DIAGNOSIS — Z7982 Long term (current) use of aspirin: Secondary | ICD-10-CM | POA: Insufficient documentation

## 2023-07-01 DIAGNOSIS — I10 Essential (primary) hypertension: Secondary | ICD-10-CM | POA: Diagnosis not present

## 2023-07-01 DIAGNOSIS — R0789 Other chest pain: Secondary | ICD-10-CM | POA: Diagnosis present

## 2023-07-01 DIAGNOSIS — R079 Chest pain, unspecified: Secondary | ICD-10-CM

## 2023-07-01 DIAGNOSIS — Z87891 Personal history of nicotine dependence: Secondary | ICD-10-CM | POA: Diagnosis not present

## 2023-07-01 DIAGNOSIS — Z79899 Other long term (current) drug therapy: Secondary | ICD-10-CM | POA: Insufficient documentation

## 2023-07-01 LAB — CBC
HCT: 46.3 % (ref 39.0–52.0)
Hemoglobin: 15.8 g/dL (ref 13.0–17.0)
MCH: 29 pg (ref 26.0–34.0)
MCHC: 34.1 g/dL (ref 30.0–36.0)
MCV: 85.1 fL (ref 80.0–100.0)
Platelets: 332 10*3/uL (ref 150–400)
RBC: 5.44 MIL/uL (ref 4.22–5.81)
RDW: 13.5 % (ref 11.5–15.5)
WBC: 8.9 10*3/uL (ref 4.0–10.5)
nRBC: 0 % (ref 0.0–0.2)

## 2023-07-01 LAB — BASIC METABOLIC PANEL
Anion gap: 13 (ref 5–15)
BUN: 16 mg/dL (ref 6–20)
CO2: 25 mmol/L (ref 22–32)
Calcium: 9.4 mg/dL (ref 8.9–10.3)
Chloride: 99 mmol/L (ref 98–111)
Creatinine, Ser: 1.02 mg/dL (ref 0.61–1.24)
GFR, Estimated: >60 mL/min (ref >60)
Glucose, Bld: 124 mg/dL — ABNORMAL HIGH (ref 70–99)
Potassium: 3.2 mmol/L — ABNORMAL LOW (ref 3.5–5.1)
Sodium: 137 mmol/L (ref 135–145)

## 2023-07-01 LAB — TROPONIN I (HIGH SENSITIVITY)
Troponin I (High Sensitivity): 3 ng/L (ref <18)
Troponin I (High Sensitivity): 3 ng/L (ref <18)

## 2023-07-01 MED ORDER — IOHEXOL 350 MG/ML SOLN
75.0000 mL | Freq: Once | INTRAVENOUS | Status: AC | PRN
  Administered 2023-07-01: 75 mL via INTRAVENOUS

## 2023-07-01 NOTE — ED Provider Notes (Signed)
 [place] EMERGENCY DEPARTMENT AT [facility] Provider Note   CSN: [id number] Arrival date & time: 07/01/23  1527     History  Chief Complaint  Patient presents with   Chest Pain   Shortness of Breath    Kevin Houston is a 48 y.o. male with medical history of hypertension, former cigarette smoker.  The patient presents to the ED for evaluation of chest pain and shortness of breath.  Patient reports that about 3 weeks ago he developed a pain to the left lower extremity that he states lasted about 3 days and resolved.  He reports that about 5 days later he developed the pain on the left side of his chest wall around his ribs that also lasted about 3 days and resolved.  States that 4 days later developed a left-sided chest pain that is been constant since onset.  Reports his current chest pain is 3 out of 10.  He is also endorsing shortness of breath.  He denies any change to his chest pain when he takes a deep breath.  He denies any change to his chest pain or shortness of breath and exerts himself.  He denies a history of DVT, PE.  Denies any hemoptysis, unilateral leg swelling, exogenous hormone use, recent surgery or travel.  Denies history of cardiac events.  Reports that he has had episodes of chest pain, most recently 5 years ago, which she went to the ER for and was told it was "nothing".  States that he never followed up with cardiology.  Denies any alleviating or aggravating factors to his chest pain.  Denies nausea vomiting, diarrhea or fevers.  Is endorsing lightheadedness, dizziness and weakness worse with standing.   Chest Pain Associated symptoms: shortness of breath   Shortness of Breath Associated symptoms: chest pain        Home Medications Prior to Admission medications   Medication Sig Start Date End Date Taking? Authorizing Provider  amLODipine (NORVASC) 10 MG tablet Take 10 mg by mouth daily.    [provider]  aspirin 81 MG chewable tablet Chew  162 mg daily as needed by mouth for mild pain.    [provider]  azithromycin (ZITHROMAX) 250 MG tablet Take 1 tablet (250 mg total) by mouth daily. Take first 2 tablets together, then 1 every day until finished. 04/12/21   Lurline Idol, FNP  chlorthalidone (HYGROTON) 25 MG tablet Take 25 mg daily by mouth. 08/02/16 08/02/17  [provider]  losartan (COZAAR) 100 MG tablet Take 100 mg daily by mouth. 01/22/17   [provider]  omeprazole (PRILOSEC) 20 MG capsule Take 1 capsule (20 mg total) daily by mouth. 03/02/17   Azalia Bilis, MD  potassium chloride SA (KLOR-CON M) 20 MEQ tablet Take 2 tablets (40 mEq total) by mouth daily for 5 days. 03/31/21 04/05/21  Maia Plan, MD  promethazine-dextromethorphan (PROMETHAZINE-DM) 6.25-15 MG/5ML syrup Take 5 mLs by mouth 4 (four) times daily as needed for cough. 04/12/21   Lurline Idol, FNP      Allergies    Patient has no known allergies.    Review of Systems   Review of Systems  Respiratory:  Positive for shortness of breath.   Cardiovascular:  Positive for chest pain.    Physical Exam Updated Vital Signs BP 110/74   Pulse 80   Temp 98.3 F (36.8 C)   Resp 20   Ht 6\' 3"  (1.905 m)   Wt (!) 142.9 kg  SpO2 98%   BMI 39.37 kg/m  Physical Exam Vitals and nursing note reviewed.  Constitutional:      General: He is not in acute distress.    Appearance: He is well-developed.  HENT:     Head: Normocephalic and atraumatic.  Eyes:     Conjunctiva/sclera: Conjunctivae normal.  Cardiovascular:     Rate and Rhythm: Normal rate and regular rhythm.     Heart sounds: No murmur heard. Pulmonary:     Effort: Pulmonary effort is normal. No respiratory distress.     Breath sounds: Normal breath sounds.  Abdominal:     Palpations: Abdomen is soft.     Tenderness: There is no abdominal tenderness.  Musculoskeletal:        General: No swelling.     Cervical back: Neck supple.     Right lower leg: No  edema.     Left lower leg: No edema.     Comments: Negative Denna Haggard' sign left lower extremity  Skin:    General: Skin is warm and dry.     Capillary Refill: Capillary refill takes less than 2 seconds.  Neurological:     Mental Status: He is alert and oriented to person, place, and time.  Psychiatric:        Mood and Affect: Mood normal.     ED Results / Procedures / Treatments   Labs (all labs ordered are listed, but only abnormal results are displayed) Labs Reviewed  BASIC METABOLIC PANEL - Abnormal; Notable for the following components:      Result Value   Potassium 3.2 (*)    Glucose, Bld 124 (*)    All other components within normal limits  CBC  I-STAT CHEM 8, ED  TROPONIN I (HIGH SENSITIVITY)  TROPONIN I (HIGH SENSITIVITY)    EKG EKG Interpretation Date/Time:  Tuesday July 01 2023 15:37:05 EDT Ventricular Rate:  97 PR Interval:  154 QRS Duration:  90 QT Interval:  364 QTC Calculation: 462 R Axis:   36  Text Interpretation: Normal sinus rhythm Cannot rule out Anterior infarct , age undetermined Abnormal ECG When compared with ECG of 31-Mar-2021 19:42, PREVIOUS ECG IS PRESENT since last tracing no significant change Confirmed by Eber Hong ([id number]) on 07/01/2023 4:13:27 PM  Radiology CT Angio Chest PE W/Cm &/Or Wo Cm Result Date: 07/01/2023 CLINICAL DATA:  Pulmonary embolism (PE) suspected, high prob Chest pain. EXAM: CT ANGIOGRAPHY CHEST WITH CONTRAST TECHNIQUE: Multidetector CT imaging of the chest was performed using the standard protocol during bolus administration of intravenous contrast. Multiplanar CT image reconstructions and MIPs were obtained to evaluate the vascular anatomy. RADIATION DOSE REDUCTION: This exam was performed according to the departmental dose-optimization program which includes automated exposure control, adjustment of the mA and/or kV according to patient size and/or use of iterative reconstruction technique. CONTRAST:  75mL OMNIPAQUE IOHEXOL  350 MG/ML SOLN COMPARISON:  Radiograph earlier today. FINDINGS: Cardiovascular: There are no filling defects within the pulmonary arteries to suggest pulmonary embolus. Normal heart size. No pericardial effusion. Normal caliber thoracic aorta without acute aortic findings. Conventional branching pattern from the aortic arch. Mediastinum/Nodes: No mediastinal or hilar adenopathy. Unremarkable appearance of the esophagus. No thyroid nodule. Lungs/Pleura: Clear lungs. No focal airspace disease, pleural effusion or features of pulmonary edema. No pulmonary nodule. Upper Abdomen: No acute findings. Musculoskeletal: There are no acute or suspicious osseous abnormalities. Thoracic spondylosis with anterior spurring. Review of the MIP images confirms the above findings. IMPRESSION: No pulmonary embolus or acute intrathoracic  abnormality. Electronically Signed   By: Narda Rutherford M.D.   On: 07/01/2023 18:47   DG Chest Port 1 View Result Date: 07/01/2023 CLINICAL DATA:  Chest pain. EXAM: PORTABLE CHEST 1 VIEW COMPARISON:  03/31/2021 FINDINGS: The cardiomediastinal contours are normal. The lungs are clear. Pulmonary vasculature is normal. No consolidation, pleural effusion, or pneumothorax. No acute osseous abnormalities are seen. IMPRESSION: No active disease. Electronically Signed   By: Narda Rutherford M.D.   On: 07/01/2023 18:42    Procedures Procedures   Medications Ordered in ED Medications  iohexol (OMNIPAQUE) 350 MG/ML injection 75 mL (75 mLs Intravenous Contrast Given 07/01/23 1754)    ED Course/ Medical Decision Making/ A&P  Medical Decision Making  48 year old male presents for evaluation.  Please see HPI for further details.  On examination, the patient is afebrile and nontachycardic.  His lung sounds are clear bilaterally, he is not hypoxic.  Abdomen is soft and compressible throughout.  Neurological examinations at baseline.  No edema to bilateral lower extremities.  Overall patient nontoxic  in appearance.  Concern for ACS versus PE.  Provider in triage ordered CTA of the patient to rule out PE.  He is technically proprietary negative however due to history, will collect CTA out of abundance of caution.  Patient troponin flat 3, 3.  Patient metabolic panel with a slightly reduced potassium 3.2, no other electrolyte derangement, anion gap 13.  CBC without leukocytosis or anemia.  Chest x-ray unremarkable.  PE study negative for PE.  EKG is nonischemic.  The patient denies any chest pain currently.  Overall patient workup is unremarkable.  At this time, will provide patient with ambulatory referral to cardiology.  Have given him strict return precautions and he voiced understanding.  He had all of his questions answered to his satisfaction.  Stable to discharge home.   Final Clinical Impression(s) / ED Diagnoses Final diagnoses:  Chest pain, unspecified type  Shortness of breath    Rx / DC Orders ED Discharge Orders          Ordered    Ambulatory referral to Cardiology        07/01/23 1942              Clent Ridges 07/01/23 1943    Eber Hong, MD 07/03/23 1455

## 2023-07-01 NOTE — ED Triage Notes (Addendum)
 Pt here for CP, ShOB, and feeling his heart is racing. 1 week ago had pain in his L leg, then had pain in his L side 3 days later, Sunday started having L sided CP with dizziness and feeling as though he is about to pass out. ShOB worse with exertion. Pt clammy in triage.

## 2023-07-01 NOTE — ED Provider Triage Note (Signed)
 Emergency Medicine Provider Triage Evaluation Note  Kevin Houston , a 48 y.o. male  was evaluated in triage.  Pt complains of SOB, chest pain.  Review of Systems  Positive:  Negative:   Physical Exam  BP 127/88 (BP Location: Right Arm)   Pulse 98   Temp 98.3 F (36.8 C)   Resp (!) 22   Ht 6\' 3"  (1.905 m)   Wt (!) 142.9 kg   SpO2 100%   BMI 39.37 kg/m  Gen:   Awake, no distress   Resp:  Normal effort  MSK:   Moves extremities without difficulty  Other:    Medical Decision Making  Medically screening exam initiated at 4:00 PM.  Appropriate orders placed.  Ludger Nutting Comes was informed that the remainder of the evaluation will be completed by another provider, this initial triage assessment does not replace that evaluation, and the importance of remaining in the ED until their evaluation is complete.  Chest pain, SOB, palpitations intermittently x1 week. This episode has been lasting at least 2 hours. also dizzy and feels like he is going to pass out.    Dorthy Cooler, [place] 07/01/23 [phone]

## 2023-07-01 NOTE — Discharge Instructions (Signed)
 It was a pleasure taking part in your care.  As we discussed, your workup is reassuring.  I am sending you to cardiology for further workup and management.  They should be calling you in the next 3 to 5 days to set up an appointment.  If they do not, please utilize information on this form to contact them.  Please return to the ED with any return of chest pain or shortness of breath.

## 2023-07-07 ENCOUNTER — Ambulatory Visit
Admission: RE | Admit: 2023-07-07 | Discharge: 2023-07-07 | Disposition: A | Discharge status: Home / Self Care | Source: Ambulatory Visit | Attending: Neurosurgery | Admitting: Neurosurgery

## 2023-07-07 DIAGNOSIS — M5416 Radiculopathy, lumbar region: Secondary | ICD-10-CM

## 2023-07-08 ENCOUNTER — Other Ambulatory Visit

## 2023-08-26 ENCOUNTER — Ambulatory Visit: Attending: Neurosurgery

## 2023-08-26 ENCOUNTER — Other Ambulatory Visit: Payer: Self-pay

## 2023-08-26 DIAGNOSIS — R2689 Other abnormalities of gait and mobility: Secondary | ICD-10-CM | POA: Insufficient documentation

## 2023-08-26 DIAGNOSIS — M5459 Other low back pain: Secondary | ICD-10-CM | POA: Insufficient documentation

## 2023-08-26 DIAGNOSIS — M6281 Muscle weakness (generalized): Secondary | ICD-10-CM | POA: Diagnosis present

## 2023-08-26 NOTE — Therapy (Addendum)
 OUTPATIENT PHYSICAL THERAPY THORACOLUMBAR EVALUATION/DISCHARGE   Patient Name: Kevin Houston MRN: [id number] DOB:20-Jan-1976, 48 y.o., male Today's Date: 08/27/2023  END OF SESSION:  PT End of Session - 08/27/23 0842     Visit Number 1    Number of Visits 17    Date for PT Re-Evaluation 10/21/23    Authorization Type [employer]    PT Start Time 1530    PT Stop Time 1610    PT Time Calculation (min) 40 min    Activity Tolerance Patient tolerated treatment well    Behavior During Therapy [facility] for tasks assessed/performed             Past Medical History:  Diagnosis Date   Hypertension    History reviewed. No pertinent surgical history. There are no active problems to display for this patient.   PCP: Rexanne Ingle, MD  REFERRING PROVIDER: Louis Shove, MD   REFERRING DIAG: [phone] (ICD-10-CM) - Radiculopathy, lumbar region   Rationale for Evaluation and Treatment: Rehabilitation  THERAPY DIAG:  Other low back pain  Muscle weakness (generalized)  Other abnormalities of gait and mobility  ONSET DATE: Chronic  SUBJECTIVE:                                                                                                                                                                                           SUBJECTIVE STATEMENT: Pt presents to PT with reports of acute on chronic R sided LBP that refers into lateral R LE. Denies weakness, N/T, bowel/bladder changes or saddle anesthesia. This pain has really decreased his standing activity tolerance to only 45 minutes. Feels frustrated by his inability to play basketball with his son. Pain decreases pretty immediately when he lies flat. Unfortunately recent injection did not provide relief for very long.   PERTINENT HISTORY:  HTN  PAIN:  Are you having pain?  Yes: NPRS scale: 1/10 Worst: 9/10 Pain location: R sided lower back, R lateral LE Pain description: sharp, N/T  Aggravating factors: standing, walking Relieving  factors: lying flat  PRECAUTIONS: None  RED FLAGS: None   WEIGHT BEARING RESTRICTIONS: No  FALLS:  Has patient fallen in last 6 months? No  LIVING ENVIRONMENT: Lives with: lives with their family Lives in: House/apartment Stairs: 1 flight   OCCUPATION: [employer] for [employer]  PLOF: Independent  PATIENT GOALS: decrease back and R LE pain, be able to play basketball with his son  OBJECTIVE:  Note: Objective measures were completed at Evaluation unless otherwise noted.  DIAGNOSTIC FINDINGS:  See MRI report in referral notes  PATIENT SURVEYS:  ODI: will perform next sessoin  COGNITION: Overall cognitive status: Within  functional limits for tasks assessed    SENSATION: WFL  POSTURE: rounded shoulders, forward head, and increased lumbar lordosis  PALPATION: TTP to R sided lumbar paraspinals  LUMBAR ROM:   AROM eval  Flexion WFL  Extension WFL  Right lateral flexion   Left lateral flexion   Right rotation   Left rotation    (Blank rows = not tested)  LOWER EXTREMITY MMT:    MMT Right eval Left eval  Hip flexion 5/5 5/5  Hip extension    Hip abduction 5/5 5/5  Hip adduction    Hip internal rotation    Hip external rotation    Knee flexion 5/5 5/5  Knee extension 5/5 5/5  Ankle dorsiflexion 5/5 5/5  Ankle plantarflexion 5/5 5/5  Ankle inversion    Ankle eversion     (Blank rows = not tested)  LUMBAR SPECIAL TESTS:  Straight leg raise test: Negative and Slump test: Negative  FUNCTIONAL TESTS:  30 Second Sit to Stand: 8 reps no UE 90/90 hold: 9 seconds  GAIT: Distance walked: 30ft Assistive device utilized: None Level of assistance: Complete Independence Comments: trunk flexed  TREATMENT: [facility] Adult PT Treatment:                                                DATE: 08/26/23 Therapeutic Exercise: POE x 60 Supine scaitic nerve glide x 10 R Supine PPT x 5 - 5 hold Bridge x 10  PATIENT EDUCATION:  Education details: eval findings, ODI, HEP,  POC Person educated: Patient Education method: Explanation, Demonstration, and Handouts Education comprehension: verbalized understanding and returned demonstration  HOME EXERCISE PROGRAM: Access Code: [email or web address] URL: [email or web address] Date: 08/26/2023 Prepared by: Alm Kingdom  Exercises - Static Prone on Elbows  - 1 x daily - 7 x weekly - 2 reps - 60 sec hold - Supine Sciatic Nerve Glide  - 1 x daily - 7 x weekly - 3 sets - 10 reps - Supine Posterior Pelvic Tilt  - 1 x daily - 7 x weekly - 2 sets - 10 reps - 5 sec hold - Supine Bridge  - 1 x daily - 7 x weekly - 3 sets - 10 reps  ASSESSMENT:  CLINICAL IMPRESSION: Patient is a 48 y.o. M who was seen today for physical therapy evaluation and treatment for R sided LBP with referral into R LE. Physical findings are consistent with MD impression as pt demonstrates decrease in core strength and general functional mobility. He shows preference for extension based stretching today at evaluation. Pt would benefit from skilled PT services in order to decrease back and LE pain and improve function.    OBJECTIVE IMPAIRMENTS: Abnormal gait, decreased activity tolerance, decreased endurance, decreased mobility, difficulty walking, decreased ROM, decreased strength, and pain  ACTIVITY LIMITATIONS: carrying, lifting, standing, squatting, stairs, transfers, and locomotion level  PARTICIPATION LIMITATIONS: driving, shopping, community activity, occupation, and yard work  PERSONAL FACTORS: Time since onset of injury/illness/exacerbation and 1 comorbidity: HTN are also affecting patient's functional outcome.   REHAB POTENTIAL: Excellent  CLINICAL DECISION MAKING: Stable/uncomplicated  EVALUATION COMPLEXITY: Low   GOALS: Goals reviewed with patient? No  SHORT TERM GOALS: Target date: 09/16/2023   Pt will be compliant and knowledgeable with initial HEP for improved comfort and carryover Baseline: initial HEP given  Goal status:  INITIAL  2.  Pt will self report  back and R LE pain no greater than 5/10 for improved comfort and functional ability Baseline: 9/10 at worst Goal status: INITIAL    LONG TERM GOALS: Target date: 10/21/2023   Pt will be decrease ODI disability score by MDIC (12%) as proxy for functional improvement with home ADLs and community activities Baseline: will assess next session Goal status: INITIAL  Pt will self report back and R LE pain no greater than 2/10 for improved comfort and functional ability Baseline: 9/10 at worst Goal status: INITIAL   3.  Pt will increase 30 Second Sit to Stand rep count to no less than 12 reps for improved balance, strength, and functional mobility Baseline: 8 reps  Goal status: INITIAL   4.  Pt will improve 90/90 hold time to no less than 45 seconds for improved core strength/endurance for decreasing LBP Baseline: 9 seconds Goal status: INITIAL  5.  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  PLAN FOR NEXT SESSION: assess HEP response, neutral spine core strengthening, extension based stretching and movements   Alm JAYSON Kingdom, PT 08/27/2023, 8:43 AM

## 2023-09-10 ENCOUNTER — Ambulatory Visit

## 2023-09-17 ENCOUNTER — Ambulatory Visit

## 2023-09-22 ENCOUNTER — Ambulatory Visit

## 2023-09-22 NOTE — Therapy (Incomplete)
 OUTPATIENT PHYSICAL THERAPY TREATMENT   Patient Name: Kevin Houston MRN: [id number] DOB:27-Feb-1976, 48 y.o., male Today's Date: 09/22/2023  END OF SESSION:    Past Medical History:  Diagnosis Date   Hypertension    No past surgical history on file. There are no active problems to display for this patient.   PCP: Merl Star, MD  REFERRING PROVIDER: Agustina Aldrich, MD   REFERRING DIAG: [phone] (ICD-10-CM) - Radiculopathy, lumbar region   Rationale for Evaluation and Treatment: Rehabilitation  THERAPY DIAG:  No diagnosis found.  ONSET DATE: Chronic  SUBJECTIVE:                                                                                                                                                                                           SUBJECTIVE STATEMENT: ***  EVAL: Pt presents to PT with reports of acute on chronic R sided LBP that refers into lateral R LE. Denies weakness, N/T, bowel/bladder changes or saddle anesthesia. This pain has really decreased his standing activity tolerance to only 45 minutes. Feels frustrated by his inability to play basketball with his son. Pain decreases pretty immediately when he lies flat. Unfortunately recent injection did not provide relief for very long.   PERTINENT HISTORY:  HTN  PAIN:  Are you having pain?  Yes: NPRS scale: 1/10 Worst: 9/10 Pain location: R sided lower back, R lateral LE Pain description: sharp, N/T  Aggravating factors: standing, walking Relieving factors: lying flat  PRECAUTIONS: None  RED FLAGS: None   WEIGHT BEARING RESTRICTIONS: No  FALLS:  Has patient fallen in last 6 months? No  LIVING ENVIRONMENT: Lives with: lives with their family Lives in: House/apartment Stairs: 1 flight   OCCUPATION: [employer]  PLOF: Independent  PATIENT GOALS: decrease back and R LE pain, be able to play basketball with his son  OBJECTIVE:  Note: Objective measures were completed at Evaluation unless  otherwise noted.  DIAGNOSTIC FINDINGS:  See MRI report in referral notes  PATIENT SURVEYS:  ODI: will perform next sessoin  COGNITION: Overall cognitive status: Within functional limits for tasks assessed    SENSATION: WFL  POSTURE: rounded shoulders, forward head, and increased lumbar lordosis  PALPATION: TTP to R sided lumbar paraspinals  LUMBAR ROM:   AROM eval  Flexion WFL  Extension WFL  Right lateral flexion   Left lateral flexion   Right rotation   Left rotation    (Blank rows = not tested)  LOWER EXTREMITY MMT:    MMT Right eval Left eval  Hip flexion 5/5 5/5  Hip extension    Hip abduction 5/5 5/5  Hip adduction  Hip internal rotation    Hip external rotation    Knee flexion 5/5 5/5  Knee extension 5/5 5/5  Ankle dorsiflexion 5/5 5/5  Ankle plantarflexion 5/5 5/5  Ankle inversion    Ankle eversion     (Blank rows = not tested)  LUMBAR SPECIAL TESTS:  Straight leg raise test: Negative and Slump test: Negative  FUNCTIONAL TESTS:  30 Second Sit to Stand: 8 reps no UE 90/90 hold: 9 seconds  GAIT: Distance walked: 58ft Assistive device utilized: None Level of assistance: Complete Independence Comments: trunk flexed  TREATMENT: [facility] Adult PT Treatment:                                                DATE: 09/22/23 Therapeutic Exercise: POE x 60" Supine scaitic nerve glide x 10 R Supine PPT x 5 - 5" hold Bridge x 10  [facility] Adult PT Treatment:                                                DATE: 08/26/23 Therapeutic Exercise: POE x 60" Supine scaitic nerve glide x 10 R Supine PPT x 5 - 5" hold Bridge x 10  PATIENT EDUCATION:  Education details: eval findings, ODI, HEP, POC Person educated: Patient Education method: Explanation, Demonstration, and Handouts Education comprehension: verbalized understanding and returned demonstration  HOME EXERCISE PROGRAM: Access Code: [email or web address] URL: [email or web address] Date:  08/26/2023 Prepared by: Loral Roch  Exercises - Static Prone on Elbows  - 1 x daily - 7 x weekly - 2 reps - 60 sec hold - Supine Sciatic Nerve Glide  - 1 x daily - 7 x weekly - 3 sets - 10 reps - Supine Posterior Pelvic Tilt  - 1 x daily - 7 x weekly - 2 sets - 10 reps - 5 sec hold - Supine Bridge  - 1 x daily - 7 x weekly - 3 sets - 10 reps  ASSESSMENT:  CLINICAL IMPRESSION: ***  EVAL: Patient is a 48 y.o. M who was seen today for physical therapy evaluation and treatment for R sided LBP with referral into R LE. Physical findings are consistent with MD impression as pt demonstrates decrease in core strength and general functional mobility. He shows preference for extension based stretching today at evaluation. Pt would benefit from skilled PT services in order to decrease back and LE pain and improve function.    OBJECTIVE IMPAIRMENTS: Abnormal gait, decreased activity tolerance, decreased endurance, decreased mobility, difficulty walking, decreased ROM, decreased strength, and pain  ACTIVITY LIMITATIONS: carrying, lifting, standing, squatting, stairs, transfers, and locomotion level  PARTICIPATION LIMITATIONS: driving, shopping, community activity, occupation, and yard work  PERSONAL FACTORS: Time since onset of injury/illness/exacerbation and 1 comorbidity: HTN are also affecting patient's functional outcome.   REHAB POTENTIAL: Excellent  CLINICAL DECISION MAKING: Stable/uncomplicated  EVALUATION COMPLEXITY: Low   GOALS: Goals reviewed with patient? No  SHORT TERM GOALS: Target date: 09/16/2023   Pt will be compliant and knowledgeable with initial HEP for improved comfort and carryover Baseline: initial HEP given  Goal status: INITIAL  2.  Pt will self report back and R LE pain no greater than 5/10 for improved comfort and functional ability Baseline: 9/10 at worst Goal status:  INITIAL    LONG TERM GOALS: Target date: 10/21/2023   Pt will be decrease ODI disability  score by MDIC (12%) as proxy for functional improvement with home ADLs and community activities Baseline: will assess next session Goal status: INITIAL  Pt will self report back and R LE pain no greater than 2/10 for improved comfort and functional ability Baseline: 9/10 at worst Goal status: INITIAL   3.  Pt will increase 30 Second Sit to Stand rep count to no less than 12 reps for improved balance, strength, and functional mobility Baseline: 8 reps  Goal status: INITIAL   4.  Pt will improve 90/90 hold time to no less than 45 seconds for improved core strength/endurance for decreasing LBP Baseline: 9 seconds Goal status: INITIAL  5.  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  PLAN FOR NEXT SESSION: assess HEP response, neutral spine core strengthening, extension based stretching and movements   Ivor Mars, PT 09/22/2023, 8:01 AM

## 2023-09-24 ENCOUNTER — Ambulatory Visit

## 2024-05-07 ENCOUNTER — Ambulatory Visit
Admission: EM | Admit: 2024-05-07 | Discharge: 2024-05-07 | Disposition: A | Discharge status: Home / Self Care | Attending: Physician Assistant | Admitting: Physician Assistant

## 2024-05-07 DIAGNOSIS — M109 Gout, unspecified: Secondary | ICD-10-CM

## 2024-05-07 MED ORDER — PREDNISONE 50 MG PO TABS: ORAL_TABLET | ORAL | 0 refills | Status: AC

## 2024-05-07 NOTE — ED Provider Notes (Signed)
 " [facility]    CSN: [id number] Arrival date & time: 05/07/24  1541      History   Chief Complaint Chief Complaint  Patient presents with   Foot Pain    HPI Kevin Houston is a 49 y.o. male.   Pt complains of pain in his right foot.  Pt reports he has had similar in the past.  Pt states he normally has improvement with prednisone . Pt has been taking tylenol for pain.  Pt denies fever or chills. No injury    Foot Pain    Past Medical History:  Diagnosis Date   Hypertension     There are no active problems to display for this patient.   No past surgical history on file.     Home Medications    Prior to Admission medications  Medication Sig Start Date End Date Taking? Authorizing Provider  amLODipine (NORVASC) 10 MG tablet Take 10 mg by mouth daily.   Yes [provider]  aspirin 81 MG chewable tablet Chew 162 mg daily as needed by mouth for mild pain.   Yes [provider]  losartan (COZAAR) 100 MG tablet Take 100 mg daily by mouth. 01/22/17  Yes [provider]  predniSONE  (DELTASONE ) 50 MG tablet One tablet a day for 5 days 05/07/24  Yes Flint Sonny POUR, PA-C  azithromycin  (ZITHROMAX ) 250 MG tablet Take 1 tablet (250 mg total) by mouth daily. Take first 2 tablets together, then 1 every day until finished. 04/12/21   Iola Lukes, FNP  chlorthalidone (HYGROTON) 25 MG tablet Take 25 mg daily by mouth. 08/02/16 08/02/17  [provider]  omeprazole  (PRILOSEC) 20 MG capsule Take 1 capsule (20 mg total) daily by mouth. 03/02/17   Baxter Drivers, MD  potassium chloride  SA (KLOR-CON  M) 20 MEQ tablet Take 2 tablets (40 mEq total) by mouth daily for 5 days. 03/31/21 04/05/21  Long, Joshua G, MD  promethazine -dextromethorphan (PROMETHAZINE -DM) 6.25-15 MG/5ML syrup Take 5 mLs by mouth 4 (four) times daily as needed for cough. 04/12/21   Iola Lukes, FNP    Family History No family history on file.  Social  History Social History[1]   Allergies   Patient has no known allergies.   Review of Systems Review of Systems  All other systems reviewed and are negative.    Physical Exam Triage Vital Signs ED Triage Vitals  Encounter Vitals Group     BP 05/07/24 1638 (!) 138/90     Girls Systolic BP Percentile --      Girls Diastolic BP Percentile --      Boys Systolic BP Percentile --      Boys Diastolic BP Percentile --      Pulse Rate 05/07/24 1638 90     Resp 05/07/24 1638 17     Temp 05/07/24 1638 98.4 F (36.9 C)     Temp Source 05/07/24 1638 Oral     SpO2 05/07/24 1638 97 %     Weight --      Height --      Head Circumference --      Peak Flow --      Pain Score 05/07/24 1644 8     Pain Loc --      Pain Education --      Exclude from Growth Chart --    No data found.  Updated Vital Signs BP (!) 138/90 (BP Location: Left Arm)   Pulse 90   Temp 98.4 F (36.9 C) (  Oral)   Resp 17   SpO2 97%   Visual Acuity Right Eye Distance:   Left Eye Distance:   Bilateral Distance:    Right Eye Near:   Left Eye Near:    Bilateral Near:     Physical Exam Vitals reviewed.  Constitutional:      Appearance: Normal appearance.  Musculoskeletal:        General: Swelling and tenderness present.     Comments: Tender right foot,  pain with movement,  nv and ns intact.   Skin:    General: Skin is warm.  Neurological:     General: No focal deficit present.     Mental Status: He is alert.      UC Treatments / Results  Labs (all labs ordered are listed, but only abnormal results are displayed) Labs Reviewed - No data to display  EKG   Radiology No results found.  Procedures Procedures (including critical care time)  Medications Ordered in UC Medications - No data to display  Initial Impression / Assessment and Plan / UC Course  I have reviewed the triage vital signs and the nursing notes.  Pertinent labs & imaging results that were available during my care of  the patient were reviewed by me and considered in my medical decision making (see chart for details).     Pt counseled on diet to decrease risk of gout.  Final Clinical Impressions(s) / UC Diagnoses   Final diagnoses:  Acute gout involving toe of right foot, unspecified cause   Discharge Instructions   None    ED Prescriptions     Medication Sig Dispense Auth. Provider   predniSONE  (DELTASONE ) 50 MG tablet One tablet a day for 5 days 5 tablet Flint Sonny POUR, PA-C      PDMP not reviewed this encounter. An After Visit Summary was printed and given to the patient.        [1]  Social History Tobacco Use   Smoking status: Former    Types: Cigarettes  Substance Use Topics   Alcohol use: Yes    Comment: occasional   Drug use: No     Flint Sonny POUR, PA-C 05/07/24 1721  "

## 2024-05-07 NOTE — ED Triage Notes (Signed)
 PT states he has a history of gout in his right foot. Patient reports pain and swelling.

## 2024-08-24 ENCOUNTER — Ambulatory Visit
